# Patient Record
Sex: Female | Born: 1990 | Race: White | Hispanic: Yes | Marital: Single | Smoking: Never smoker
Health system: Southern US, Community
[De-identification: ages and names within clinical notes are randomized; demographics above are authoritative.]

## PROBLEM LIST (undated history)

## (undated) DIAGNOSIS — O24419 Gestational diabetes mellitus in pregnancy, unspecified control: Secondary | ICD-10-CM

---
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
## 2009-01-16 ENCOUNTER — Ambulatory Visit: Payer: Self-pay | Admitting: Physician Assistant

## 2009-01-16 ENCOUNTER — Inpatient Hospital Stay (HOSPITAL_COMMUNITY): Admission: AD | Admit: 2009-01-16 | Discharge: 2009-01-16 | Payer: Self-pay | Admitting: Obstetrics & Gynecology

## 2009-01-18 ENCOUNTER — Ambulatory Visit (HOSPITAL_COMMUNITY): Admission: RE | Admit: 2009-01-18 | Discharge: 2009-01-18 | Payer: Self-pay | Admitting: Obstetrics & Gynecology

## 2009-02-08 ENCOUNTER — Ambulatory Visit (HOSPITAL_COMMUNITY): Admission: RE | Admit: 2009-02-08 | Discharge: 2009-02-08 | Payer: Self-pay | Admitting: Obstetrics & Gynecology

## 2009-03-03 IMAGING — US US OB COMP LESS 14 WK
1 series · 14 of 28 positions shown · non-contrast
Comparison: none

CLINICAL DATA: A bleeding yesterday

[Series 1: us ob comp less 14 wks · 14 of 41 slices shown]
[im 2/41]
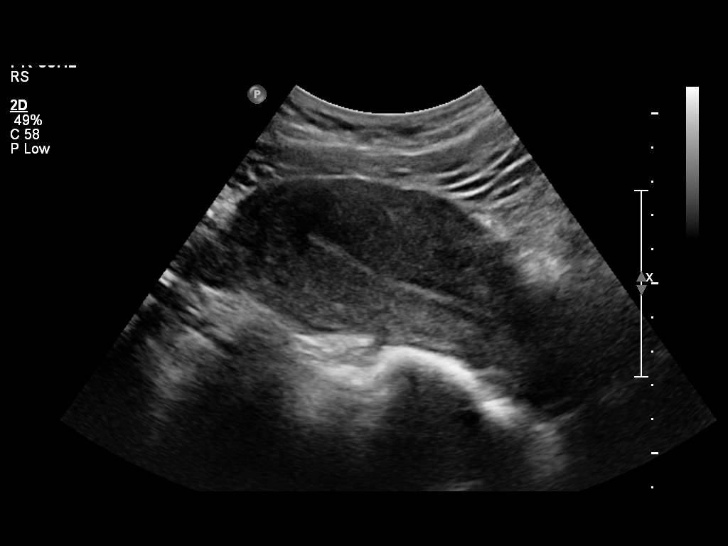
[im 5/41]
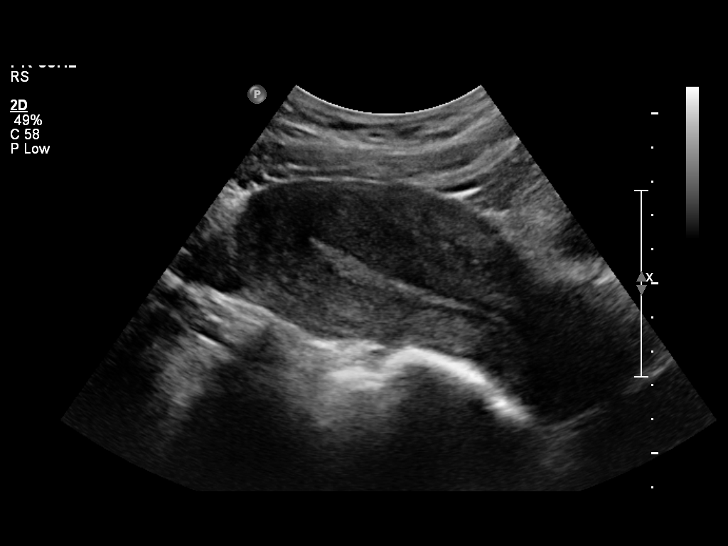
[im 8/41]
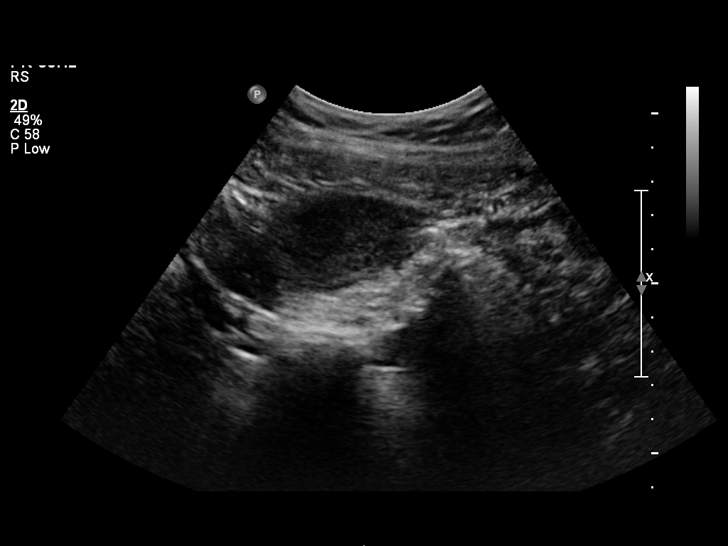
[im 11/41]
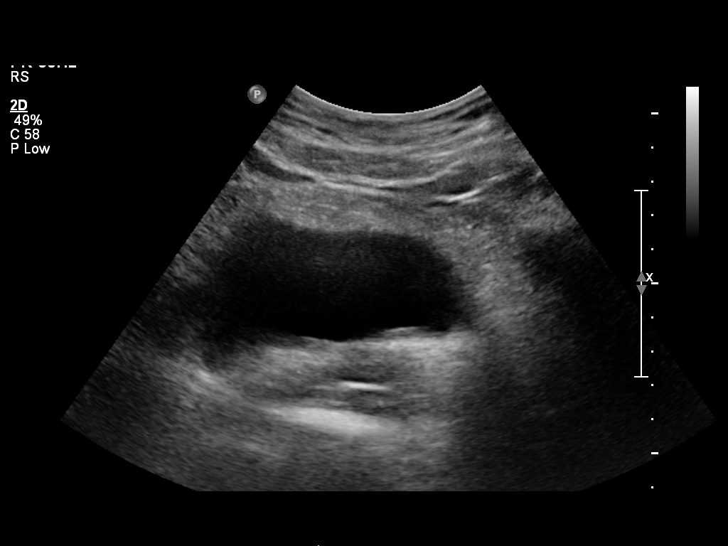
[im 14/41]
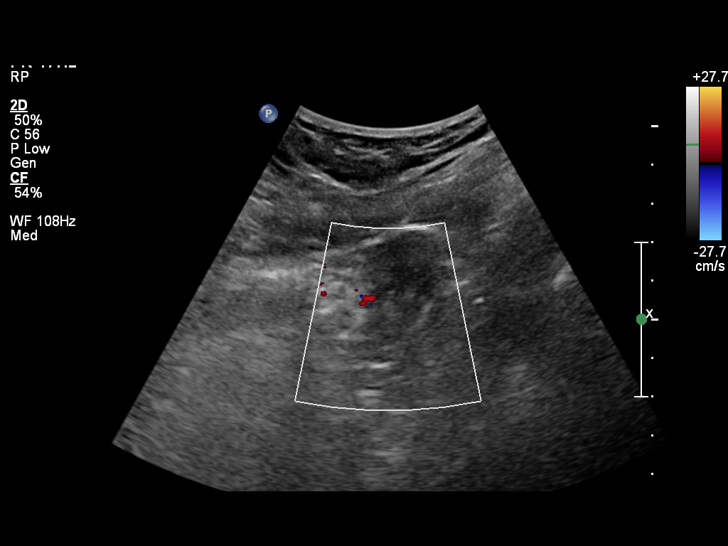
[im 17/41]
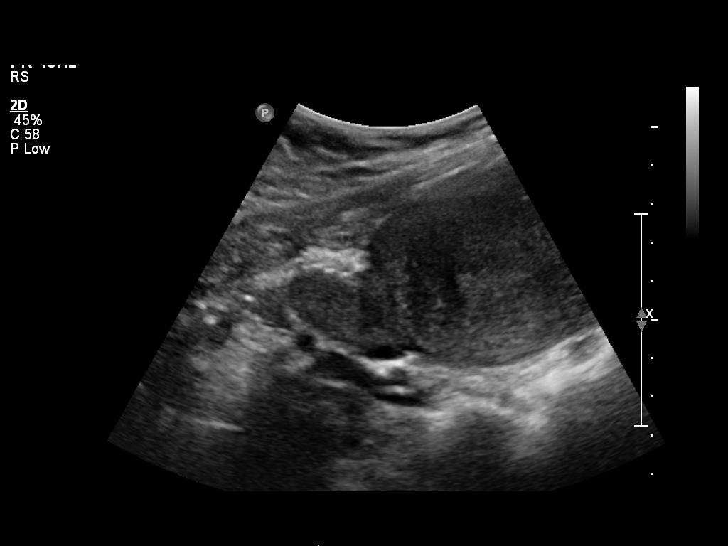
[im 20/41]
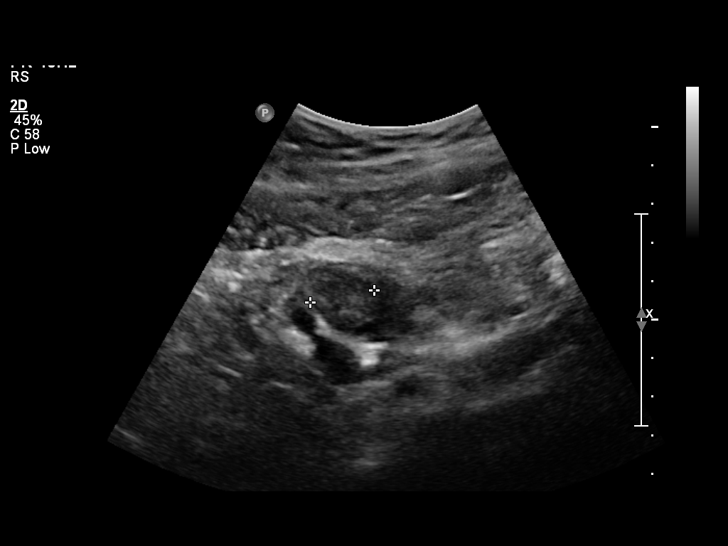
[im 23/41]
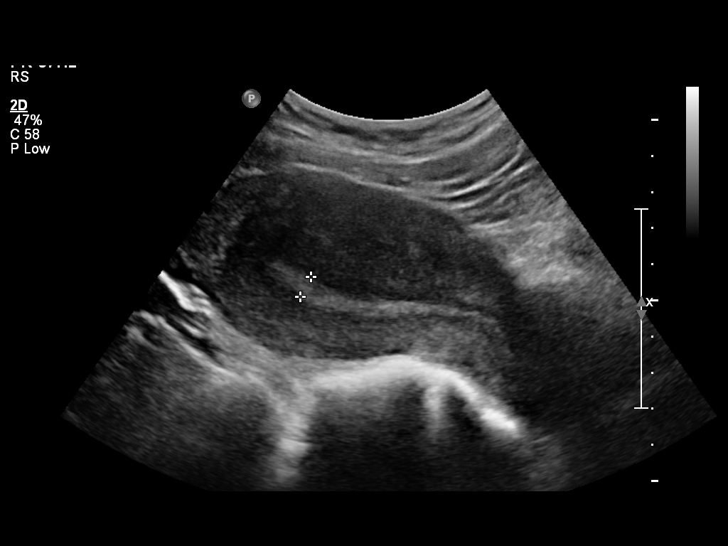
[im 26/41]
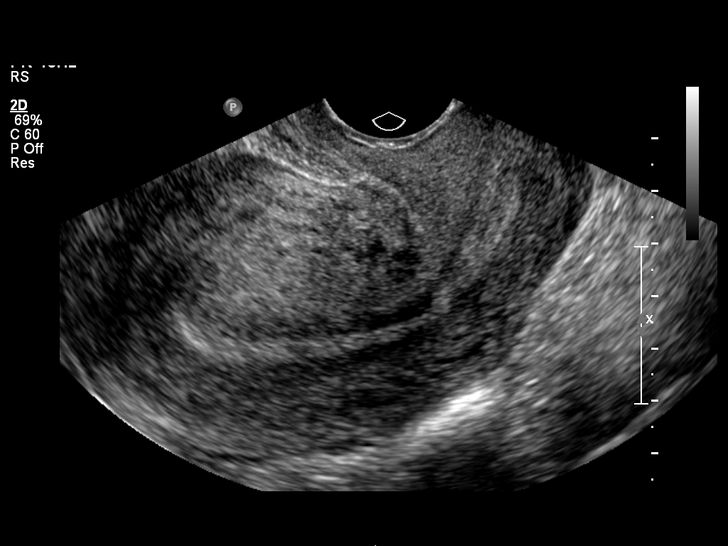
[im 29/41]
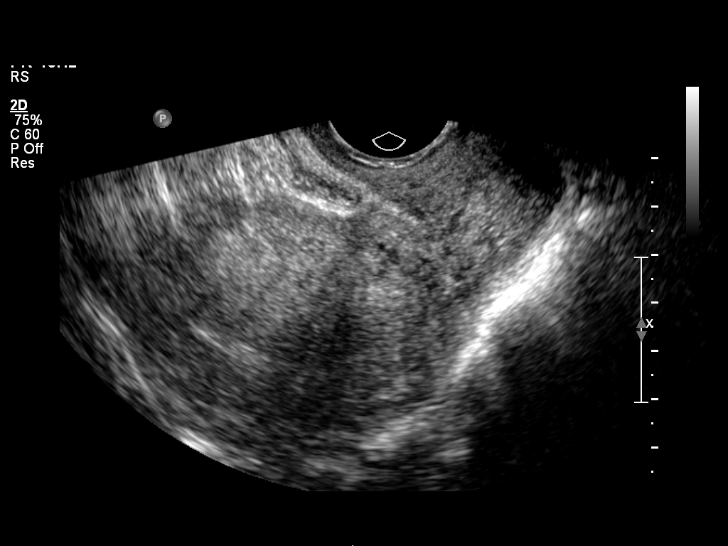
[im 32/41]
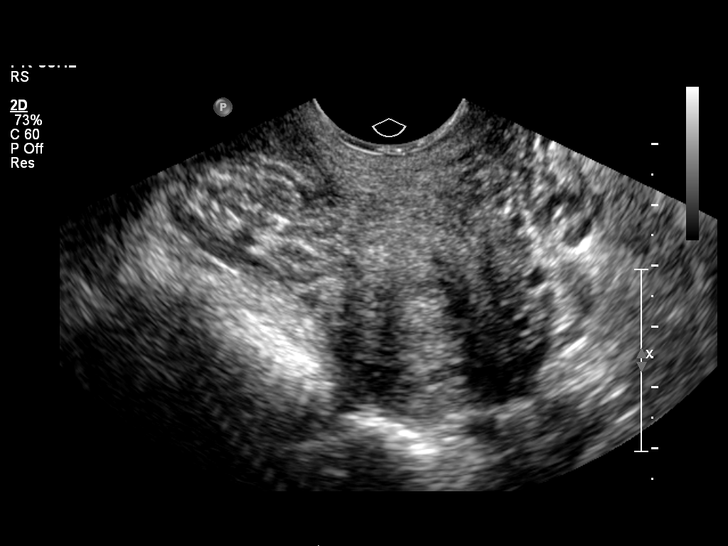
[im 35/41]
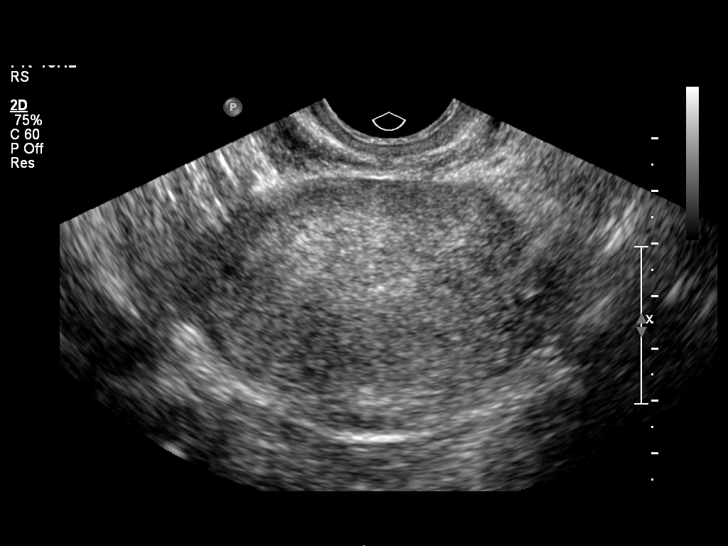
[im 38/41]
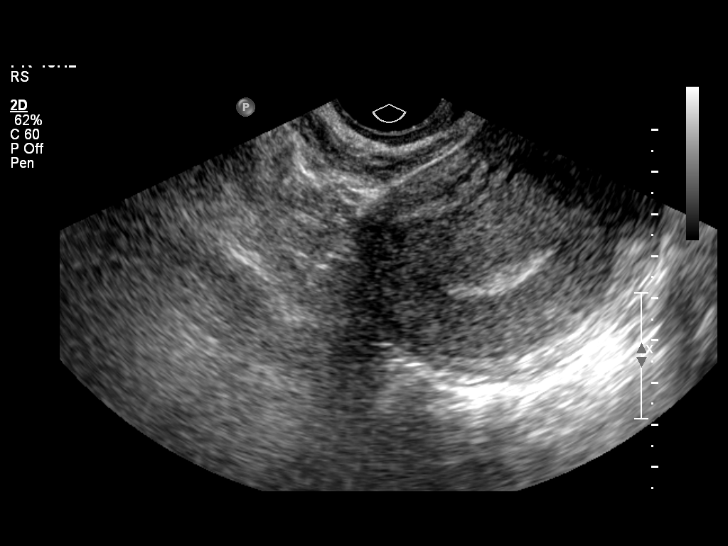
[im 41/41]
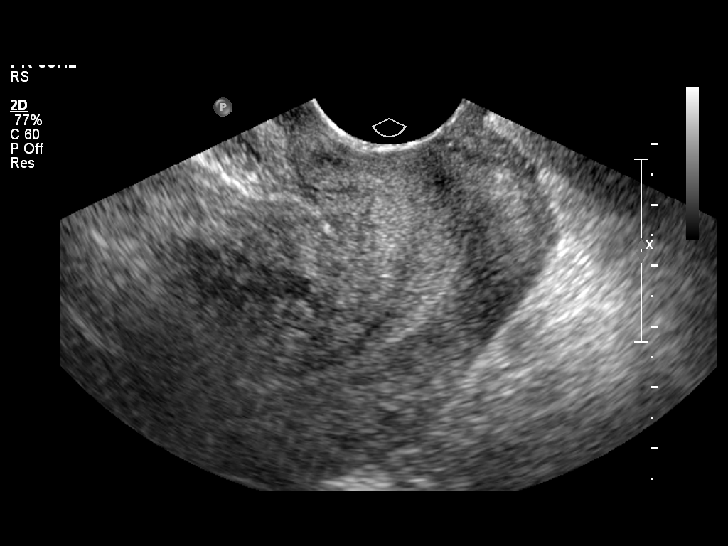

[14 of 28 positions shown; findings below may reference images not displayed]

FINDINGS: There is no gestational sac, yolk sac, embryo or cardiac
activity identified.  The endometrium measures 12.6 mm with a
trilaminar appearance.  Both ovaries are normal appearance with
normal blood flow.  No free fluid identified.
IMPRESSION: There is no evidence for an intrauterine pregnancy.
No abnormal masses or free fluid.

## 2009-05-28 ENCOUNTER — Inpatient Hospital Stay (HOSPITAL_COMMUNITY): Admission: AD | Admit: 2009-05-28 | Discharge: 2009-05-28 | Payer: Self-pay | Admitting: Obstetrics & Gynecology

## 2009-05-30 ENCOUNTER — Inpatient Hospital Stay (HOSPITAL_COMMUNITY): Admission: AD | Admit: 2009-05-30 | Discharge: 2009-05-31 | Payer: Self-pay | Admitting: Obstetrics & Gynecology

## 2009-05-30 ENCOUNTER — Ambulatory Visit: Payer: Self-pay | Admitting: Obstetrics & Gynecology

## 2010-03-27 IMAGING — US US OB DETAIL+14 WK
1 series · 14 of 28 positions shown · non-contrast
Comparison: none

OBSTETRICAL ULTRASOUND:
 This ultrasound exam was performed in the [HOSPITAL] Ultrasound Department.  The OB US report was generated in the AS system, and faxed to the ordering physician.  This report is also available in [REDACTED] PACS.

[Series 1: us ob detail +14 wk · 93 acquisitions, 14 frames shown]
[im 4/93]
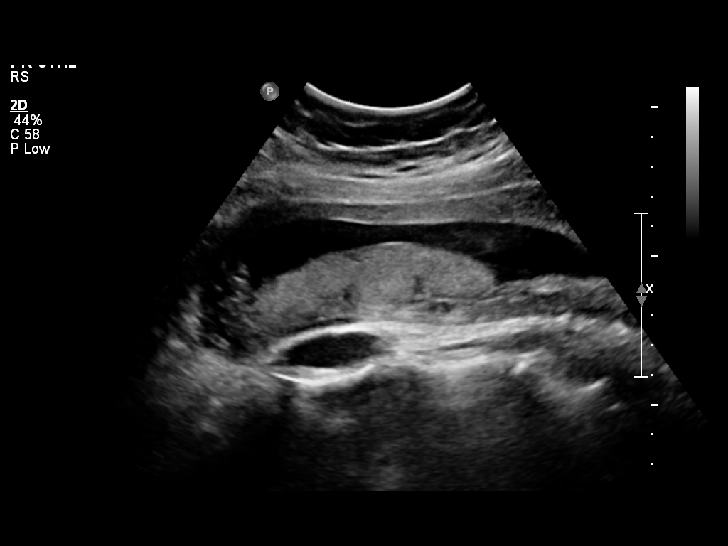
[im 11/93]
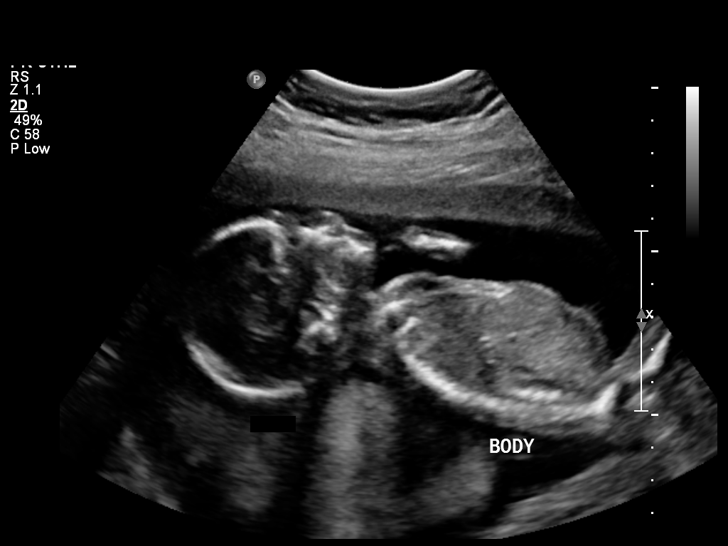
[im 18/93]
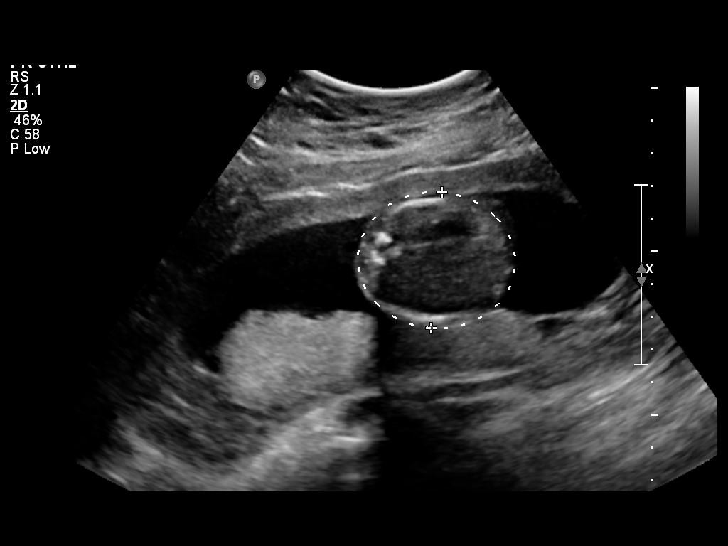
[im 24/93]
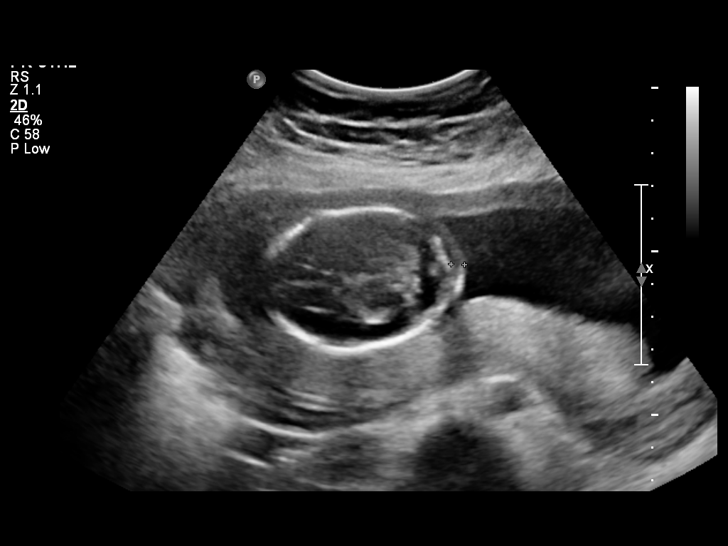
[im 31/93]
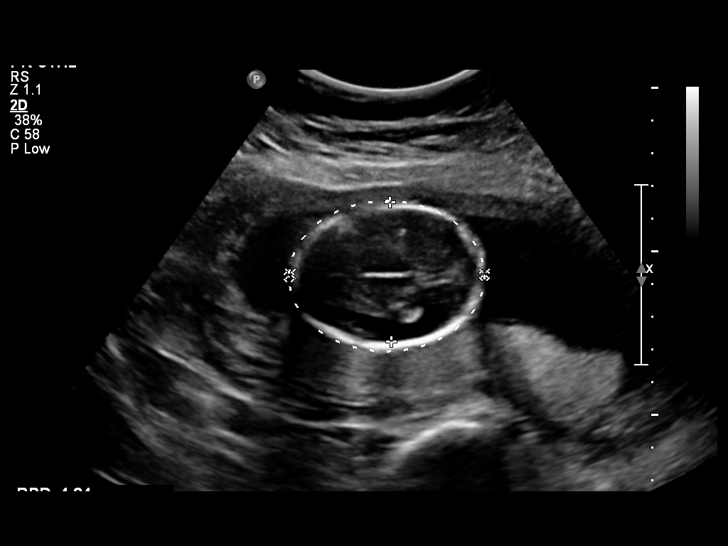
[im 38/93]
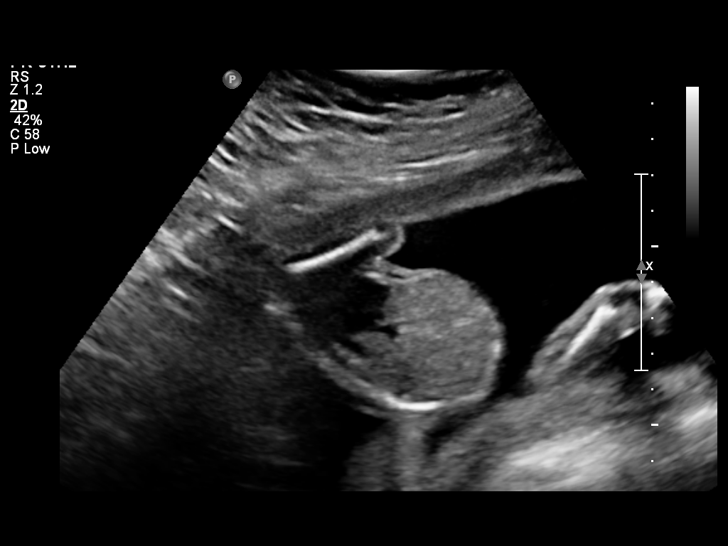
[im 45/93]
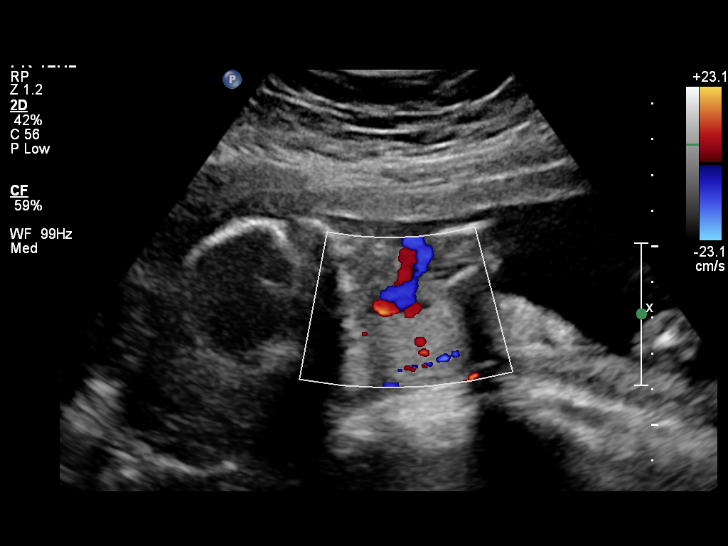
[im 52/93]
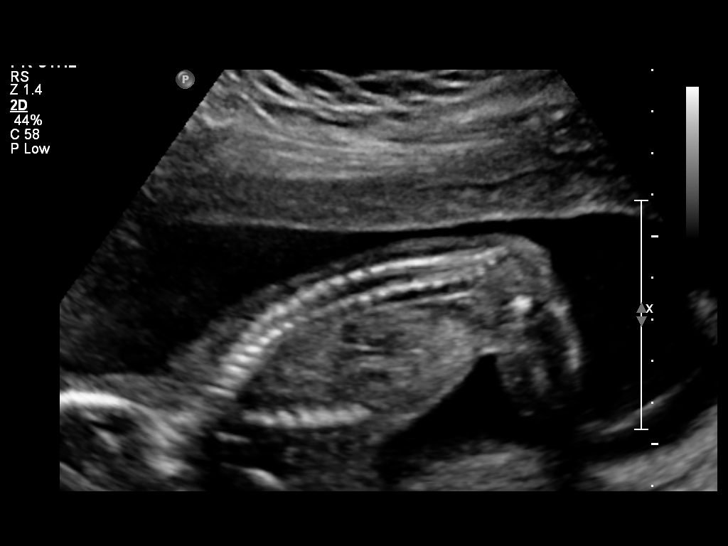
[im 58/93]
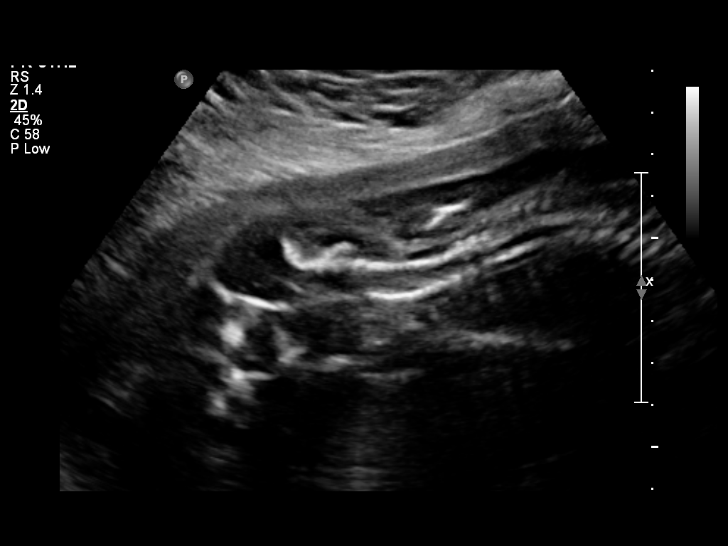
[im 65/93]
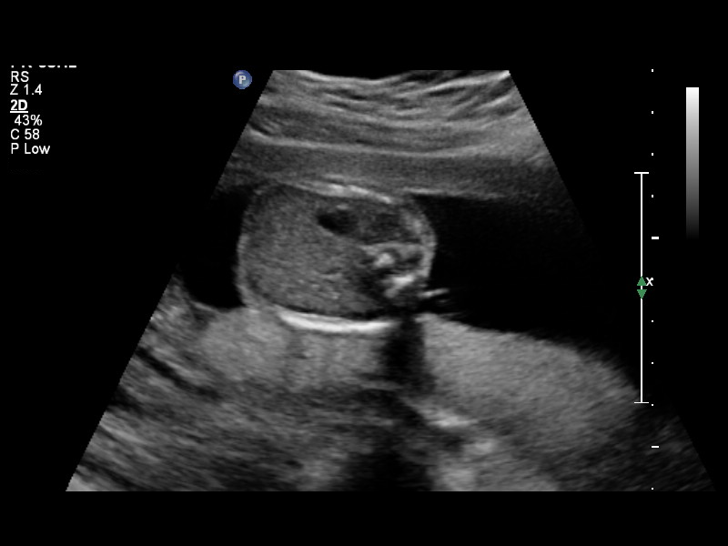
[im 72/93]
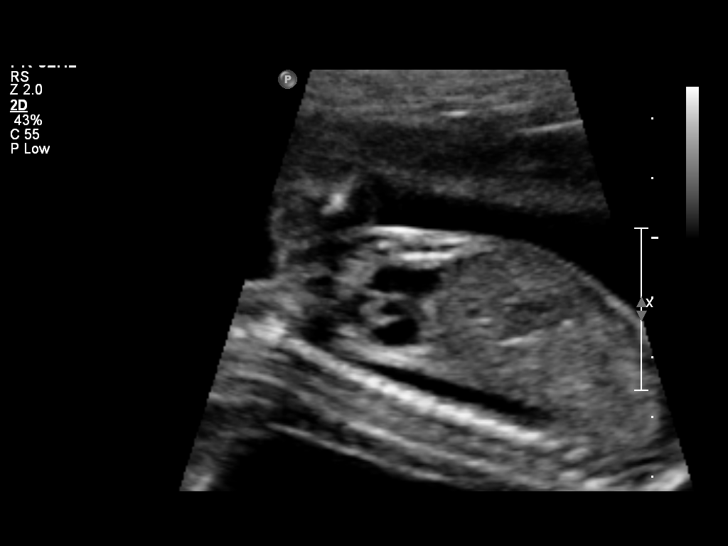
[im 79/93]
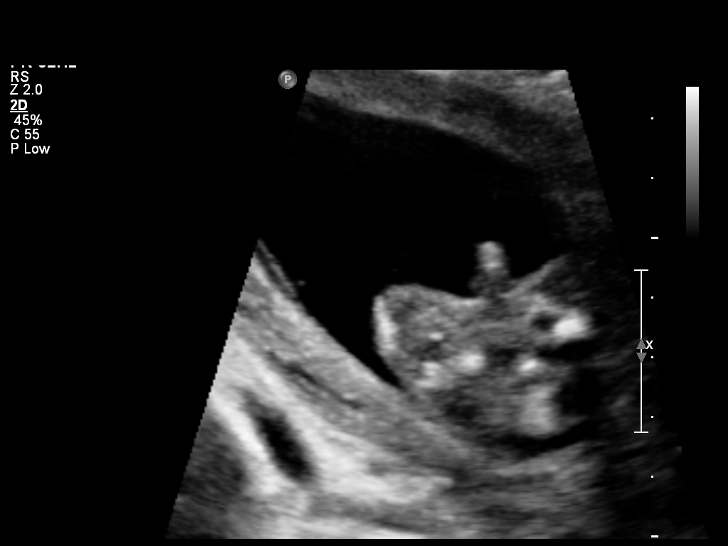
[im 86/93]
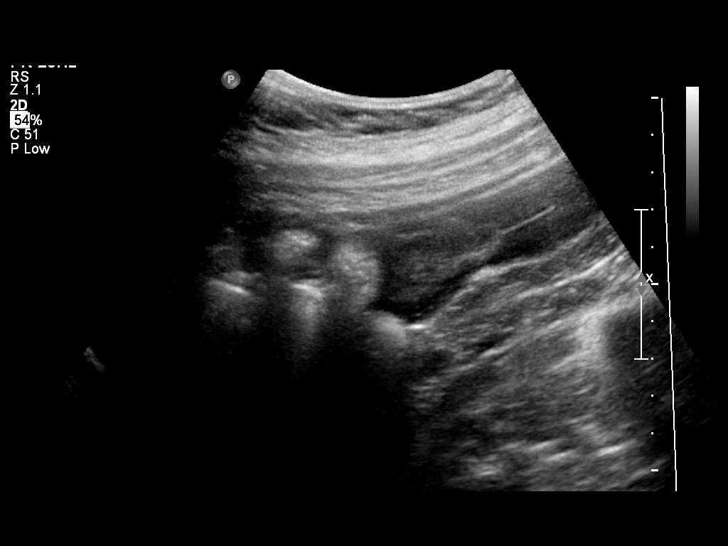
[im 93/93]
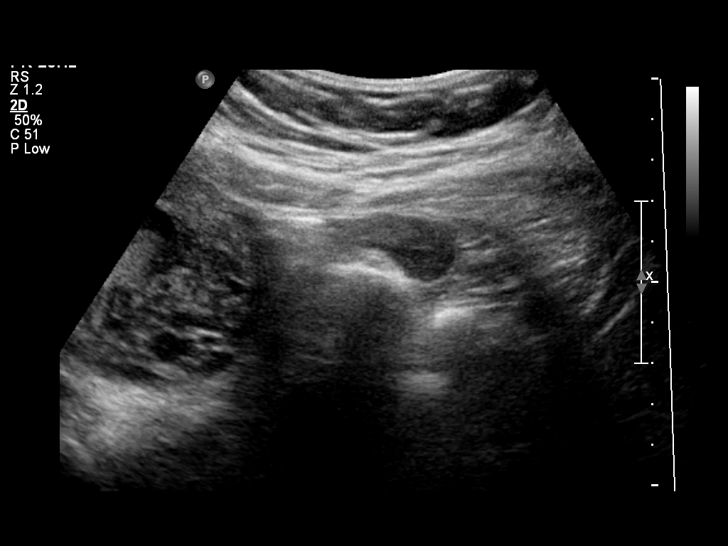

[14 of 28 positions shown; findings below may reference images not displayed]

IMPRESSION: See AS Obstetric US report.

## 2010-09-21 NOTE — L&D Delivery Note (Signed)
Delivery Note At 6:43 PM a viable female was delivered via Vaginal, Spontaneous Delivery (Presentation: Right Occiput Anterior).  APGAR: 7, 10; weight 7 lb 8.3 oz (3410 g).   Placenta status: Intact, Spontaneous by Veatrice Kells  Cord: 3 vessels with the following complications: None. Nuchal foot x 2   Anesthesia: None  Episiotomy: None Lacerations: None Est. Blood Loss (mL): 250cc  Mom to postpartum.  Baby to nursery-stable.  Charisma Charlot E. 04/08/2011, 7:15 PM

## 2010-12-17 ENCOUNTER — Other Ambulatory Visit: Payer: Self-pay | Admitting: Family Medicine

## 2010-12-17 DIAGNOSIS — Z3689 Encounter for other specified antenatal screening: Secondary | ICD-10-CM

## 2010-12-17 LAB — ABO/RH: RH Type: POSITIVE

## 2010-12-22 ENCOUNTER — Ambulatory Visit (HOSPITAL_COMMUNITY)
Admission: RE | Admit: 2010-12-22 | Discharge: 2010-12-22 | Disposition: A | Discharge status: Home / Self Care | Payer: Medicaid Other | Source: Ambulatory Visit | Attending: Family Medicine | Admitting: Family Medicine

## 2010-12-22 DIAGNOSIS — Z363 Encounter for antenatal screening for malformations: Secondary | ICD-10-CM | POA: Insufficient documentation

## 2010-12-22 DIAGNOSIS — Z3689 Encounter for other specified antenatal screening: Secondary | ICD-10-CM

## 2010-12-22 DIAGNOSIS — Z1389 Encounter for screening for other disorder: Secondary | ICD-10-CM | POA: Insufficient documentation

## 2010-12-22 DIAGNOSIS — O358XX0 Maternal care for other (suspected) fetal abnormality and damage, not applicable or unspecified: Secondary | ICD-10-CM | POA: Insufficient documentation

## 2010-12-26 LAB — CBC
HCT: 37.1 % (ref 36.0–46.0)
Hemoglobin: 12.7 g/dL (ref 12.0–15.0)
MCHC: 34.3 g/dL (ref 30.0–36.0)
MCHC: 34.3 g/dL (ref 30.0–36.0)
MCV: 95.8 fL (ref 78.0–100.0)
Platelets: 147 10*3/uL — ABNORMAL LOW (ref 150–400)
Platelets: 152 10*3/uL (ref 150–400)
RBC: 3.64 MIL/uL — ABNORMAL LOW (ref 3.87–5.11)
RBC: 3.88 MIL/uL (ref 3.87–5.11)
WBC: 11.6 10*3/uL — ABNORMAL HIGH (ref 4.0–10.5)

## 2010-12-26 LAB — URINALYSIS, ROUTINE W REFLEX MICROSCOPIC
Hgb urine dipstick: NEGATIVE
Nitrite: NEGATIVE
Protein, ur: NEGATIVE mg/dL
Specific Gravity, Urine: 1.01 (ref 1.005–1.030)
Urobilinogen, UA: 0.2 mg/dL (ref 0.0–1.0)
pH: 6.5 (ref 5.0–8.0)

## 2011-01-01 LAB — URINALYSIS, ROUTINE W REFLEX MICROSCOPIC
Glucose, UA: NEGATIVE mg/dL
Hgb urine dipstick: NEGATIVE
Leukocytes, UA: NEGATIVE
Protein, ur: NEGATIVE mg/dL
Urobilinogen, UA: 1 mg/dL (ref 0.0–1.0)

## 2011-01-01 LAB — POCT I-STAT, CHEM 8
Calcium, Ion: 1.12 mmol/L (ref 1.12–1.32)
Hemoglobin: 11.9 g/dL — ABNORMAL LOW (ref 12.0–15.0)
Potassium: 3.4 mEq/L — ABNORMAL LOW (ref 3.5–5.1)
TCO2: 20 mmol/L (ref 0–100)

## 2011-01-01 LAB — URINE MICROSCOPIC-ADD ON

## 2011-02-02 ENCOUNTER — Inpatient Hospital Stay (HOSPITAL_COMMUNITY)
Admission: AD | Admit: 2011-02-02 | Discharge: 2011-02-02 | Disposition: A | Discharge status: Home / Self Care | Payer: Self-pay | Source: Ambulatory Visit | Attending: Family Medicine | Admitting: Family Medicine

## 2011-02-02 DIAGNOSIS — O4700 False labor before 37 completed weeks of gestation, unspecified trimester: Secondary | ICD-10-CM

## 2011-02-02 LAB — URINALYSIS, ROUTINE W REFLEX MICROSCOPIC
Bilirubin Urine: NEGATIVE
Nitrite: NEGATIVE
Specific Gravity, Urine: 1.025 (ref 1.005–1.030)
Urobilinogen, UA: 1 mg/dL (ref 0.0–1.0)
pH: 6.5 (ref 5.0–8.0)

## 2011-02-06 NOTE — Discharge Summary (Signed)
[name], [name]    ACCOUNT NO.:  [id number]   MEDICAL RECORD NO.:  [id number]          PATIENT TYPE:  INP   LOCATION:  9149                          FACILITY:  [facility]   PHYSICIAN:  Phil D. Okey Hess, M.D.     DATE OF BIRTH:  05-Oct-1990   DATE OF ADMISSION:  05/25/2005  DATE OF DISCHARGE:  05/29/2005                                 DISCHARGE SUMMARY   ADMISSION DIAGNOSES:  1.  A 20 year old G1, P0, at 32-2/7 weeks, with preterm uterine contractions      and gestational diabetes.  2.  Fetal heart tones reassuring.   DISCHARGE DIAGNOSES:  1.  A 20 year old G1, P0, at 32-5/7 weeks, with preterm uterine contractions      and gestational diabetes.  2.  Fetal heart tones reassuring.   DISCHARGE MEDICATIONS:  1.  Procardia 30 mg XL one tablet p.o. b.i.d.  2.  Prenatal vitamins one tablet p.o. daily.   ADMISSION HISTORY:  A 20 year old G1, P0, at 32-2/7 weeks with preterm  uterine contractions and gestational diabetes and cervical changes, was  admitted to prenatal after terbutaline x3 failed to stop uterine  contractions.   HOSPITAL COURSE:  Problem 1.  PRETERM UTERINE CONTRACTIONS:  Per patient and previous  examination on the day before admission, the cervix was closed.  On day of  admission, the cervix was external os 1 cm and internal os fingertip.  The  patient received at MAU terbutaline 0.25 mg subcutaneously every 30 minutes  x3.  The patient continued to have uterine contractions every four minutes,  therefore was admitted to antenatal.  The patient was placed on Procardia 10  mg IV q.6h.  A wet prep showed bacteria and wbc's too numerous to count.  The patient was placed on metronidazole 500 mg one tablet p.o. b.i.d.  GBS  status was unknown; therefore, Unasyn IV was started.  Certainly, an  infection in this patient could be the cause of preterm uterine  contractions.  An ultrasound on admission showed polyhydramnios.  This  certainly can contribute to preterm  uterine contractions.  Throughout the  rest of hospitalization, uterine contractions decreased and the patient  showed only uterine irritability and uterine contractions one to two in one  hour. The patient did not complain of abdominal pain, LOF, vaginal bleeding,  and fetal movements were always positive.  The patient had a fibronectin  test positive.  GBS was negative.  By day of discharge, SVE showed:  External os 1 cm, internal os fingertip.  Therefore, no cervical changes  were noted during the hospitalization.  By day of discharge, the patient was  clinically stable, no uterine contractions were recorded.  FHT was  reassuring throughout the rest of hospitalization.  The patient was  discharged home on Procardia 30 mg XL one tablet p.o. b.i.d.  The patient  has a follow-up appointment on September 13 at 8:45 a.m. at high-risk clinic  at [facility].  The patient did receive betamethasone 12.5 mg IM  q.24h. x2.  The patient received a four-day course of Unasyn IV.   Problem 2.  GESTATIONAL DIABETES:  Initially CBGs during the a.m. before  breakfast showed blood sugars up to 178.  The patient was placed on insulin  NPH 10 units subcutaneously q.a.m. and q.h.s.  NPH was increased up to 18  units b.i.d.  Initially blood sugars were increased secondary to steroids  q.24h. x2.  By day of discharge, CBGs ranged from 80-90s pre- and  postprandial.  Insulin NPH was discontinued secondary to low blood sugars.  The patient was discharged home on diabetic diet.  The patient will check  blood sugar levels during a.m., which in bed is a fasting, and before and  two hours after meals.  This issue to be followed at high-risk clinic at  [facility] on September 13 at 8:45 a.m.   CONDITION ON DISCHARGE:  Stable.   PERTINENT LABS:  Ultrasound done on September 5 showed a single living  intrauterine pregnancy, mild polyhydramnios, maternal cervix showed 3 cm in  length, no previa, cephalic,  mean gestational age of 34-2/7 weeks.  No fetal  abnormalities.  Fetal fibronectin test was positive.  GC and Chlamydia  negative.  GBS negative.   INSTRUCTIONS GIVEN TO THE PATIENT:  Return to MAU or to see your doctor if  menstrual-like cramps, uterine contractions, low back pain, pelvic pressure,  increased vaginal discharge, vaginal bleeding, leaking of fluids, negative  fetal movement.   FOLLOW-UP APPOINTMENT:  The patient has a follow-up appointment on June 03, 2005, at the high-risk clinic at [facility] at 8:45 a.m.   The patient understands follow-up appointment, medical regimen.  The patient  must rest at home, no sexual activity, no thing per vagina.      Cheryl Medal, MD    ______________________________  Cheryl Hess, M.D.    Cheryl Hess  D:  05/29/2005  T:  05/29/2005  Job:  [id number]

## 2011-03-23 ENCOUNTER — Observation Stay (HOSPITAL_COMMUNITY)
Admission: AD | Admit: 2011-03-23 | Discharge: 2011-03-23 | Disposition: A | Discharge status: Home / Self Care | Payer: Self-pay | Source: Ambulatory Visit | Attending: Obstetrics & Gynecology | Admitting: Obstetrics & Gynecology

## 2011-03-23 DIAGNOSIS — O4700 False labor before 37 completed weeks of gestation, unspecified trimester: Secondary | ICD-10-CM

## 2011-03-23 LAB — CBC
MCH: 32.5 pg (ref 26.0–34.0)
MCHC: 34.7 g/dL (ref 30.0–36.0)
Platelets: 161 10*3/uL (ref 150–400)
RDW: 12.9 % (ref 11.5–15.5)

## 2011-03-23 LAB — RPR: RPR Ser Ql: NONREACTIVE

## 2011-03-31 LAB — STREP B DNA PROBE: GBS: NEGATIVE

## 2011-04-08 ENCOUNTER — Inpatient Hospital Stay (HOSPITAL_COMMUNITY)
Admission: AD | Admit: 2011-04-08 | Discharge: 2011-04-10 | DRG: 775 | Disposition: A | Discharge status: Home / Self Care | Payer: Medicaid Other | Source: Ambulatory Visit | Attending: Obstetrics and Gynecology | Admitting: Obstetrics and Gynecology

## 2011-04-08 ENCOUNTER — Encounter (HOSPITAL_COMMUNITY): Payer: Self-pay

## 2011-04-08 DIAGNOSIS — IMO0001 Reserved for inherently not codable concepts without codable children: Secondary | ICD-10-CM

## 2011-04-08 DIAGNOSIS — Z3490 Encounter for supervision of normal pregnancy, unspecified, unspecified trimester: Secondary | ICD-10-CM

## 2011-04-08 HISTORY — DX: Gestational diabetes mellitus in pregnancy, unspecified control: O24.419

## 2011-04-08 LAB — CBC
HCT: 39.4 % (ref 36.0–46.0)
MCHC: 34.8 g/dL (ref 30.0–36.0)
Platelets: 158 10*3/uL (ref 150–400)
RDW: 12.9 % (ref 11.5–15.5)
WBC: 9.9 10*3/uL (ref 4.0–10.5)

## 2011-04-08 MED ORDER — LANOLIN HYDROUS EX OINT: TOPICAL_OINTMENT | CUTANEOUS | Status: DC | PRN

## 2011-04-08 MED ORDER — OXYCODONE-ACETAMINOPHEN 5-325 MG PO TABS
1.0000 | ORAL_TABLET | ORAL | Status: DC | PRN
Start: 2011-04-08 — End: 2011-04-10

## 2011-04-08 MED ORDER — DIPHENHYDRAMINE HCL 25 MG PO CAPS: 25.0000 mg | ORAL_CAPSULE | Freq: Four times a day (QID) | ORAL | Status: DC | PRN

## 2011-04-08 MED ORDER — FENTANYL 2.5 MCG/ML BUPIVACAINE 1/10 % EPIDURAL INFUSION (WH - ANES)
14.0000 mL/h | INTRAMUSCULAR | Status: DC
  Filled 2011-04-08: qty 60

## 2011-04-08 MED ORDER — EPHEDRINE 5 MG/ML INJ
10.0000 mg | INTRAVENOUS | Status: DC | PRN
  Filled 2011-04-08: qty 4

## 2011-04-08 MED ORDER — ONDANSETRON HCL 4 MG/2ML IJ SOLN: 4.0000 mg | INTRAMUSCULAR | Status: DC | PRN

## 2011-04-08 MED ORDER — CITRIC ACID-SODIUM CITRATE 334-500 MG/5ML PO SOLN: 30.0000 mL | ORAL | Status: DC | PRN

## 2011-04-08 MED ORDER — OXYCODONE-ACETAMINOPHEN 5-325 MG PO TABS: 2.0000 | ORAL_TABLET | ORAL | Status: DC | PRN

## 2011-04-08 MED ORDER — FLEET ENEMA 7-19 GM/118ML RE ENEM: 1.0000 | ENEMA | RECTAL | Status: DC | PRN

## 2011-04-08 MED ORDER — PHENYLEPHRINE 40 MCG/ML (10ML) SYRINGE FOR IV PUSH (FOR BLOOD PRESSURE SUPPORT): 80.0000 ug | PREFILLED_SYRINGE | INTRAVENOUS | Status: DC | PRN

## 2011-04-08 MED ORDER — ZOLPIDEM TARTRATE 5 MG PO TABS: 5.0000 mg | ORAL_TABLET | Freq: Every evening | ORAL | Status: DC | PRN

## 2011-04-08 MED ORDER — LACTATED RINGERS IV SOLN: INTRAVENOUS | Status: DC

## 2011-04-08 MED ORDER — BENZOCAINE-MENTHOL 20-0.5 % EX AERO
1.0000 "application " | INHALATION_SPRAY | CUTANEOUS | Status: DC | PRN
  Administered 2011-04-09: 1 via TOPICAL

## 2011-04-08 MED ORDER — LACTATED RINGERS IV SOLN: 500.0000 mL | Freq: Once | INTRAVENOUS | Status: DC

## 2011-04-08 MED ORDER — SIMETHICONE 80 MG PO CHEW: 80.0000 mg | CHEWABLE_TABLET | ORAL | Status: DC | PRN

## 2011-04-08 MED ORDER — PRENATAL PLUS 27-1 MG PO TABS
1.0000 | ORAL_TABLET | Freq: Every day | ORAL | Status: DC
  Administered 2011-04-09 – 2011-04-10 (×2): 1 via ORAL
  Filled 2011-04-08 (×2): qty 1

## 2011-04-08 MED ORDER — IBUPROFEN 600 MG PO TABS
600.0000 mg | ORAL_TABLET | Freq: Four times a day (QID) | ORAL | Status: DC
  Administered 2011-04-09 – 2011-04-10 (×5): 600 mg via ORAL
  Filled 2011-04-08 (×5): qty 1

## 2011-04-08 MED ORDER — ACETAMINOPHEN 325 MG PO TABS: 650.0000 mg | ORAL_TABLET | ORAL | Status: DC | PRN

## 2011-04-08 MED ORDER — PHENYLEPHRINE 40 MCG/ML (10ML) SYRINGE FOR IV PUSH (FOR BLOOD PRESSURE SUPPORT)
80.0000 ug | PREFILLED_SYRINGE | INTRAVENOUS | Status: DC | PRN
  Filled 2011-04-08: qty 5

## 2011-04-08 MED ORDER — LIDOCAINE HCL (PF) 1 % IJ SOLN: 30.0000 mL | INTRAMUSCULAR | Status: DC | PRN

## 2011-04-08 MED ORDER — OXYTOCIN 20 UNITS IN LACTATED RINGERS INFUSION - SIMPLE
125.0000 mL/h | Freq: Once | INTRAVENOUS | Status: AC
  Administered 2011-04-08: 999 mL/h via INTRAVENOUS
  Filled 2011-04-08: qty 1000

## 2011-04-08 MED ORDER — LACTATED RINGERS IV SOLN: 500.0000 mL | INTRAVENOUS | Status: DC | PRN

## 2011-04-08 MED ORDER — DIPHENHYDRAMINE HCL 50 MG/ML IJ SOLN: 12.5000 mg | INTRAMUSCULAR | Status: DC | PRN

## 2011-04-08 MED ORDER — SENNOSIDES-DOCUSATE SODIUM 8.6-50 MG PO TABS
1.0000 | ORAL_TABLET | Freq: Every day | ORAL | Status: DC
  Administered 2011-04-08: 2 via ORAL
  Administered 2011-04-09: 1 via ORAL

## 2011-04-08 MED ORDER — EPHEDRINE 5 MG/ML INJ: 10.0000 mg | INTRAVENOUS | Status: DC | PRN

## 2011-04-08 MED ORDER — ONDANSETRON HCL 4 MG PO TABS: 4.0000 mg | ORAL_TABLET | ORAL | Status: DC | PRN

## 2011-04-08 MED ORDER — IBUPROFEN 600 MG PO TABS
600.0000 mg | ORAL_TABLET | Freq: Four times a day (QID) | ORAL | Status: DC | PRN
  Administered 2011-04-08: 600 mg via ORAL
  Filled 2011-04-08: qty 1

## 2011-04-08 MED ORDER — ONDANSETRON HCL 4 MG/2ML IJ SOLN: 4.0000 mg | Freq: Four times a day (QID) | INTRAMUSCULAR | Status: DC | PRN

## 2011-04-08 MED ORDER — WITCH HAZEL-GLYCERIN EX PADS: MEDICATED_PAD | CUTANEOUS | Status: DC | PRN

## 2011-04-08 MED ORDER — TETANUS-DIPHTH-ACELL PERTUSSIS 5-2.5-18.5 LF-MCG/0.5 IM SUSP
0.5000 mL | Freq: Once | INTRAMUSCULAR | Status: AC
  Administered 2011-04-09: 0.5 mL via INTRAMUSCULAR
  Filled 2011-04-08: qty 0.5

## 2011-04-08 NOTE — Progress Notes (Signed)
G4P3 contractions since the morning small amount of bleeding

## 2011-04-08 NOTE — H&P (Signed)
Cheryl Hess is a 20 y.o. [id number] at [redacted]w[redacted]d presenting for labor. Maternal Medical History:  Reason for admission: Reason for admission: contractions.  Prenatal complications: no prenatal complications   OB History    Grav Para Term Preterm Abortions TAB SAB Ect Mult Living   4 2 1 1 1  1   2      No past medical history on file. No past surgical history on file. Family History: family history is not on file. Social History:  does not have a smoking history on file. She does not have any smokeless tobacco history on file. Her alcohol and drug histories not on file.  Review of Systems  Unable to perform ROS: language    Dilation: 7 Effacement (%): 90 Station: 0 Exam by:: Lucy Chris RNC There were no vitals taken for this visit. Maternal Exam:  Uterine Assessment: Contraction strength is firm.  Contraction frequency is regular.   Abdomen: Fetal presentation: vertex  Introitus: not evaluated.   Pelvis: adequate for delivery.   Cervix: Cervix evaluated by digital exam.     Fetal Exam Fetal Monitor Review: Baseline rate: 130.  Variability: moderate (6-25 bpm).   Pattern: accelerations present.    Fetal State Assessment: Category I - tracings are normal.     Physical Exam  Constitutional: She is oriented to person, place, and time. She appears well-developed and well-nourished. She appears distressed.  Cardiovascular: Normal rate.   Respiratory: Effort normal.  GI: Soft. There is no tenderness.  Musculoskeletal: Normal range of motion.  Neurological: She is alert and oriented to person, place, and time.  Skin: Skin is warm and dry.  Psychiatric: She has a normal mood and affect.    Prenatal labs: ABO, Rh:  A pos  Antibody:  neg Rubella:  imm RPR: NON REACTIVE (07/02 0140)  HBsAg:   neg HIV:   neg GBS: Negative (07/10 0000)   Assessment/Plan: 20 yo [id number] @ 38.[redacted] wks EGA in active labor, stable status Admit to L&D with routine orders, anticipate  SVD   Shaw Dobek 04/08/2011, 6:14 PM

## 2011-04-09 MED ORDER — BENZOCAINE-MENTHOL 20-0.5 % EX AERO
INHALATION_SPRAY | CUTANEOUS | Status: AC
  Administered 2011-04-09: 1 via TOPICAL
  Filled 2011-04-09: qty 56

## 2011-04-09 NOTE — Progress Notes (Signed)
04/09/2011 Cheryl Hess  Interpreter  I assisted the patient with her breakfast order.  

## 2011-04-09 NOTE — Progress Notes (Signed)
Post Partum Day 1  Subjective: No complaints other than mild pain in her lower abdomen. Lochia is becoming lighter over time. Up ad lib with no dizziness or loss of balance, denies HA. + Voiding and + flatus. No BM thus far. She has ordered breakfast. She is breast feeding without problems, but would like to supplement with some formula--Nursing staff aware of this. Ms. Husk would like implanon for [facility].   Objective: Blood pressure 103/64, pulse 73, temperature 98.4 F (36.9 C), temperature source Oral, resp. rate 18, height 5\' 2"  (1.575 m), weight 84.823 kg (187 lb), last menstrual period 07/13/2010, unknown if currently breastfeeding.  Physical Exam:  General: alert, cooperative and no distress, resting comfortably in bed.  Heart: RRR, S1S2 distinct without murmur, clicks, rubs, gallops Lungs: clear to auscultation bilaterally Abdomen: no masses, non-distended, +BS x4, mild tenderness to palpation of lower abdomen with no guarding Uterine Fundus: firm Lochia: appropriate PV: No cords or calf tenderness, No significant calf/ankle edema, DP pulses 2+ bilaterally DVT Evaluation: No evidence of DVT seen on physical exam.    Basename 04/08/11 1820  HGB 13.7  HCT 39.4    Assessment: 19 y/o [id number] s/p NSVD pp day 1  Plan: 1) Continue post partum care 2) Continue breast feeding, supplement with formula as needed 3) Plan for discharge tomorrow    LOS: 1 day   AHLFIELD, Amanuel Sinkfield, PA-S2 04/09/2011, 7:39 AM

## 2011-04-09 NOTE — Discharge Summary (Signed)
Obstetric Discharge Summary Reason for Admission: onset of labor Prenatal Procedures: ultrasound Intrapartum Procedures: spontaneous vaginal delivery Postpartum Procedures: none Complications-Operative and Postpartum: none  Hemoglobin  Date Value Range Status  04/08/2011 13.7  12.0-15.0 (g/dL) Final     HCT  Date Value Range Status  04/08/2011 39.4  36.0-46.0 (%) Final    Discharge Diagnoses: Term Pregnancy-delivered  Discharge Information: Date: 04/09/2011 Activity: pelvic rest Diet: routine Medications: PNV and Ibuprophen Condition: stable Instructions: refer to practice specific booklet and See AVS Discharge to: home   Newborn Data: Live born  Information for the patient's newborn:  Cheryl Hess, Cheryl Hess [[id number]]  female ; APGAR  7/10 ; weight 8#3oz Home with mother.  Cheryl Hess,Cheryl Hess 04/09/2011, 10:05 PM

## 2011-04-09 NOTE — Progress Notes (Signed)
Pt seen and examined with PA-S Karis Juba, agree with above.

## 2011-04-09 NOTE — Progress Notes (Signed)
UR Chart review completed.  

## 2011-04-10 MED ORDER — MEASLES, MUMPS & RUBELLA VAC ~~LOC~~ INJ: 0.5000 mL | INJECTION | Freq: Once | SUBCUTANEOUS | Status: DC

## 2011-04-10 MED ORDER — TETANUS-DIPHTH-ACELL PERTUSSIS 5-2.5-18.5 LF-MCG/0.5 IM SUSP: 0.5000 mL | Freq: Once | INTRAMUSCULAR | Status: DC

## 2011-04-10 MED ORDER — SODIUM CHLORIDE 0.9 % IJ SOLN: 3.0000 mL | INTRAMUSCULAR | Status: DC | PRN

## 2011-04-10 MED ORDER — SODIUM CHLORIDE 0.9 % IJ SOLN: 3.0000 mL | Freq: Two times a day (BID) | INTRAMUSCULAR | Status: DC

## 2011-04-10 MED ORDER — BENZOCAINE-MENTHOL 20-0.5 % EX AERO: 1.0000 "application " | INHALATION_SPRAY | CUTANEOUS | Status: DC | PRN

## 2011-04-10 MED ORDER — IBUPROFEN 600 MG PO TABS: 600.0000 mg | ORAL_TABLET | Freq: Four times a day (QID) | ORAL | Status: AC

## 2011-04-10 MED ORDER — SIMETHICONE 80 MG PO CHEW: 80.0000 mg | CHEWABLE_TABLET | ORAL | Status: DC | PRN

## 2011-04-10 MED ORDER — IBUPROFEN 600 MG PO TABS: 600.0000 mg | ORAL_TABLET | Freq: Four times a day (QID) | ORAL | Status: AC | PRN

## 2011-04-10 MED ORDER — PRENATAL PLUS 27-1 MG PO TABS: 1.0000 | ORAL_TABLET | Freq: Every day | ORAL | Status: DC

## 2011-04-10 MED ORDER — SODIUM CHLORIDE 0.9 % IV SOLN: 250.0000 mL | INTRAVENOUS | Status: DC

## 2011-04-10 MED ORDER — WITCH HAZEL-GLYCERIN EX PADS: MEDICATED_PAD | CUTANEOUS | Status: DC | PRN

## 2011-04-10 MED ORDER — LANOLIN HYDROUS EX OINT: TOPICAL_OINTMENT | CUTANEOUS | Status: DC | PRN

## 2011-04-10 MED ORDER — IBUPROFEN 600 MG PO TABS: 600.0000 mg | ORAL_TABLET | Freq: Four times a day (QID) | ORAL | Status: DC

## 2011-04-10 MED ORDER — OXYCODONE-ACETAMINOPHEN 5-325 MG PO TABS: 1.0000 | ORAL_TABLET | ORAL | Status: DC | PRN

## 2011-04-10 MED ORDER — ONDANSETRON HCL 4 MG/2ML IJ SOLN: 4.0000 mg | INTRAMUSCULAR | Status: DC | PRN

## 2011-04-10 MED ORDER — ZOLPIDEM TARTRATE 5 MG PO TABS: 5.0000 mg | ORAL_TABLET | Freq: Every evening | ORAL | Status: DC | PRN

## 2011-04-10 MED ORDER — ONDANSETRON HCL 4 MG PO TABS: 4.0000 mg | ORAL_TABLET | ORAL | Status: DC | PRN

## 2011-04-10 MED ORDER — SENNOSIDES-DOCUSATE SODIUM 8.6-50 MG PO TABS: 1.0000 | ORAL_TABLET | Freq: Every day | ORAL | Status: DC

## 2011-04-10 MED ORDER — DIPHENHYDRAMINE HCL 25 MG PO CAPS: 25.0000 mg | ORAL_CAPSULE | Freq: Four times a day (QID) | ORAL | Status: DC | PRN

## 2011-04-10 NOTE — Progress Notes (Signed)
PATIENT GIVING A LOT OF BOTTLES.  REVIEWED BREASTFEEDING BASICS.  PT STATES SHE IS READY TO GO HOME AND COMFORTABLE WITH LATCH.  ENCOURAGED PATIENT TO DISCONTINUE FORMULA.

## 2011-04-10 NOTE — Progress Notes (Signed)
Post Partum Day 2  Subjective: no complaints, up ad lib, voiding, tolerating PO and + flatus  Objective: Blood pressure 94/60, pulse 71, temperature 98.4 F (36.9 C), temperature source Oral, resp. rate 18, height 5\' 2"  (1.575 m), weight 84.823 kg (187 lb), last menstrual period 07/13/2010, unknown if currently breastfeeding.  Physical Exam:  General: alert, cooperative and no distress Lochia: appropriate Uterine Fundus: firm Incision:  DVT Evaluation: No evidence of DVT seen on physical exam.   Basename 04/08/11 1820  HGB 13.7  HCT 39.4    Assessment/Plan: Discharge home   LOS: 2 days   CRESENZO-DISHMAN,Shantera Monts 04/10/2011, 7:25 AM

## 2011-04-19 ENCOUNTER — Inpatient Hospital Stay (HOSPITAL_COMMUNITY): Admission: AD | Admit: 2011-04-19 | Payer: Self-pay | Source: Ambulatory Visit | Admitting: Obstetrics & Gynecology

## 2011-06-16 LAB — CBC
HCT: 37.7
Hemoglobin: 13.2
MCHC: 35.1
RBC: 4.15

## 2011-06-16 LAB — HCG, QUANTITATIVE, PREGNANCY: hCG, Beta Chain, Quant, S: 98 — ABNORMAL HIGH

## 2011-06-16 LAB — WET PREP, GENITAL
Clue Cells Wet Prep HPF POC: NONE SEEN
Yeast Wet Prep HPF POC: NONE SEEN

## 2011-06-16 LAB — POCT PREGNANCY, URINE: Preg Test, Ur: POSITIVE

## 2011-06-16 LAB — ABO/RH: ABO/RH(D): A POS

## 2011-07-20 NOTE — Discharge Summary (Signed)
  NAMEJODE, Cheryl Hess NO.:  [id number]  MEDICAL RECORD NO.:  [id number]  LOCATION:  9173                          FACILITY:  [facility]  PHYSICIAN:  Scheryl Darter, MD       DATE OF BIRTH:  [birth year]  DATE OF ADMISSION:  03/23/2011 DATE OF DISCHARGE:  03/23/2011                              DISCHARGE SUMMARY   ADMISSION DIAGNOSES: 1. Intrauterine pregnancy at 36-2/7th weeks. 2. Early labor.  DISCHARGE DIAGNOSES: 1. Intrauterine pregnancy at 36-2/7th weeks. 2. Prodromal contractions without cervical change, not in labor.  HOSPITAL PROCEDURES: 1. Electronic fetal monitoring. 2. IV antibiotics.  HOSPITAL COURSE:  The patient was admitted at 3:00 a.m. with contractions every 3-5 minutes.  Cervix was 3-cm thick and -2.  Fetal heart rate was reactive.  She was admitted for presumed labor but over the course of the next several hours her contractions became less and less painful and less frequent and her cervix remained unchanged by 8:00 a.m. and decision was made at that point to discharge home with labor precautions.  We used an interpreter to discuss this with the patient and the patient is agreeable.  Upon discharge, fetal heart rate is reactive and contractions are every 5-7 minutes and mild.  Cervix was unchanged and she was deemed to have received full benefit of her hospital stay and was discharged home.  DISCHARGE MEDICATIONS:  Prenatal vitamins.  DISCHARGE LABORATORY DATA:  None.  DISCHARGE INSTRUCTIONS:  The patient was given routine labor precautions and states understanding.  DISCHARGE FOLLOWUP:  The patient has an appointment on Friday at [facility] but knows that if her labor picks back up and gets more active or if she ruptures her membranes or has bleeding or decreased fetal movement, she will come back in.  CONDITION AT DISCHARGE:  Good.     Marie L. Mayford Knife, C.N.M.   ______________________________ Scheryl Darter, MD    MLW/MEDQ   D:  03/23/2011  T:  03/23/2011  Job:  [id number]  Electronically Signed by Wynelle Bourgeois C.N.M. on 04/28/2011 04:30:32 PM Electronically Signed by Scheryl Darter MD on 07/20/2011 11:05:39 AM

## 2014-07-23 ENCOUNTER — Encounter (HOSPITAL_COMMUNITY): Payer: Self-pay

## 2016-05-26 ENCOUNTER — Encounter (HOSPITAL_COMMUNITY): Payer: Self-pay | Admitting: *Deleted

## 2016-06-05 ENCOUNTER — Ambulatory Visit (HOSPITAL_COMMUNITY)
Admission: RE | Admit: 2016-06-05 | Discharge: 2016-06-05 | Disposition: A | Discharge status: Home / Self Care | Payer: Medicaid Other | Source: Ambulatory Visit | Attending: Obstetrics and Gynecology | Admitting: Obstetrics and Gynecology

## 2016-06-05 ENCOUNTER — Encounter (HOSPITAL_COMMUNITY): Payer: Self-pay | Admitting: *Deleted

## 2016-06-05 VITALS — BP 102/70 | Temp 98.4°F | Ht 62.0 in | Wt 158.6 lb

## 2016-06-05 DIAGNOSIS — Z1239 Encounter for other screening for malignant neoplasm of breast: Secondary | ICD-10-CM

## 2016-06-05 DIAGNOSIS — R87612 Low grade squamous intraepithelial lesion on cytologic smear of cervix (LGSIL): Secondary | ICD-10-CM

## 2016-06-05 NOTE — Progress Notes (Signed)
[facility] due to having an abnormal Pap smear on 05/06/2016 that a colposcopy is recommended for follow up.   Pap Smear: Pap smear not completed today. [facility]. [facility] for [facility]. Referral has been sent and awaiting appointment. Will call patient when receive follow up appointment. Per patient her most recent Pap smear is the only abnormal Pap smear she has had. Last Pap smear result is in EPIC.   Physical exam: Breasts Breasts symmetrical. No skin abnormalities bilateral breasts. No nipple retraction bilateral breasts. No nipple discharge bilateral breasts. No lymphadenopathy. No lumps palpated bilateral breasts. No complaints of pain or tenderness on exam. Screening mammogram recommended at age 25 unless clinically indicated prior.   Pelvic/Bimanual No Pap smear completed today since last Pap smear was 05/06/2016. Pap smear not indicated per BCCCP guidelines.   Smoking History: Patient has never smoked.  Patient Navigation: Patient education provided. Access to services provided for patient through [facility] program. Spanish interpreter provided.  Used Spanish interpreter [place].

## 2016-06-05 NOTE — Patient Instructions (Signed)
Explained breast self awareness to Cheryl Hess. Patient did not need a Pap smear today due to last Pap smear was 05/06/2016. Explained the colposcopy and the importance of follow up for her abnormal Pap smear. [facility] to the [facility] for [facility]. Referral has been sent and awaiting appointment. Will call patient when receive follow up appointment. Informed patient that she will need a screening mammogram at age 25 unless clinically indicated prior. Cheryl Hess verbalized understanding.  Issaih Kaus, Kathaleen Maserhristine Poll, RN 2:11 PM

## 2016-06-09 ENCOUNTER — Encounter (HOSPITAL_COMMUNITY): Payer: Self-pay | Admitting: *Deleted

## 2016-06-16 ENCOUNTER — Telehealth (HOSPITAL_COMMUNITY): Payer: Self-pay | Admitting: *Deleted

## 2016-06-16 NOTE — Telephone Encounter (Signed)
Telephoned patient at home number and left message to return calling concerning appointment about colposcopy. Used interpreter Delorise RoyalsJulie Sowell

## 2016-07-06 ENCOUNTER — Telehealth (HOSPITAL_COMMUNITY): Payer: Self-pay | Admitting: *Deleted

## 2016-07-06 NOTE — Telephone Encounter (Signed)
Telephoned patient at home number and [facility] 8[facility]40. Patient voiced understanding. Used interpreter Delorise RoyalsJulie Sowell.

## 2016-07-14 ENCOUNTER — Other Ambulatory Visit (HOSPITAL_COMMUNITY)
Admission: RE | Admit: 2016-07-14 | Discharge: 2016-07-14 | Disposition: A | Discharge status: Home / Self Care | Payer: Medicaid Other | Source: Ambulatory Visit | Attending: Obstetrics and Gynecology | Admitting: Obstetrics and Gynecology

## 2016-07-14 ENCOUNTER — Encounter: Payer: Self-pay | Admitting: Obstetrics and Gynecology

## 2016-07-14 ENCOUNTER — Ambulatory Visit (INDEPENDENT_AMBULATORY_CARE_PROVIDER_SITE_OTHER): Payer: Self-pay | Admitting: Obstetrics and Gynecology

## 2016-07-14 DIAGNOSIS — R87612 Low grade squamous intraepithelial lesion on cytologic smear of cervix (LGSIL): Secondary | ICD-10-CM

## 2016-07-14 DIAGNOSIS — N870 Mild cervical dysplasia: Secondary | ICD-10-CM | POA: Insufficient documentation

## 2016-07-14 LAB — POCT PREGNANCY, URINE: Preg Test, Ur: NEGATIVE

## 2016-07-14 NOTE — Patient Instructions (Signed)
Colposcopa - Cuidados posteriores (Colposcopy, Care After) Siga estas instrucciones durante las prximas semanas. Estas indicaciones le proporcionan informacin general acerca de cmo deber cuidarse despus del procedimiento. El mdico tambin podr darle instrucciones ms especficas. El tratamiento se ha planificado de acuerdo a las prcticas mdicas actuales, pero a veces se producen problemas. Comunquese con el mdico si tiene algn problema o tiene dudas despus del procedimiento. QU ESPERAR DESPUS DEL PROCEDIMIENTO  Despus del procedimiento, es tpico tener las siguientes sensaciones:  Clicos. Generalmente se calman en algunos minutos.  Dolor. Puede durar hasta dos das.  Aturdimiento. Si esto le ocurre, recustese durante algunos minutos. Podr tener un sangrado leve o una secrecin oscura que debe detenerse en algunos das. Durante este tiempo deber usar un apsito sanitario. INSTRUCCIONES PARA EL CUIDADO EN EL HOGAR  Evite las relaciones sexuales, las duchas vaginales y el uso de tampones durante 3 das, o segn lo que le indique su mdico.  Tome slo medicamentos de venta libre o recetados, segn las indicaciones del mdico. No tome aspirina, ya que puede causar hemorragias.  Si utiliza pldoras anticonceptivas, contine tomndolas.  No todos los resultados estarn disponibles durante su visita. En este caso, tenga otra entrevista con su mdico para conocerlos. No suponga que es normal si no tiene noticias de su mdico o del establecimiento de salud. Es importante el seguimiento de todos los resultados de los estudios.  Siga los consejos de su mdico con respecto a los medicamentos, actividades, visitas y [place] de control. SOLICITE ATENCIN MDICA SI:  Aparece una erupcin cutnea.  Tiene problemas con los medicamentos. SOLICITE ATENCIN MDICA DE INMEDIATO SI:  Tiene una hemorragia abundante o elimina cogulos.  Tiene fiebre.  Tiene flujo vaginal  anormal.  Tiene clicos que no se alivian luego de tomar analgsicos.  Se siente mareada, tiene vahdos o se desmaya.  Siente dolor en el estmago.   Esta informacin no tiene como fin reemplazar el consejo del mdico. Asegrese de hacerle al mdico cualquier pregunta que tenga.   Document Released: 06/28/2013 Elsevier Interactive Patient Education 2016 Elsevier Inc.  

## 2016-07-14 NOTE — Progress Notes (Signed)
Spanish video interpreter [place] [phone] [id number] used for visit

## 2016-07-14 NOTE — Progress Notes (Signed)
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 The prior pap showed LGSIL with no HPV testing.  Prior cervical/vaginal disease: none. Prior cervical treatment: no treatment.  Procedure Details  The risks and benefits of the procedure and Written informed consent obtained.  Speculum placed in vagina and excellent visualization of cervix achieved, cervix swabbed x 3 with acetic acid solution.  Findings: Cervix: acetowhite lesion(s) noted at 12 and 6 o'clock; endocervical curettage performed and cervical biopsies taken at 12 and 6 o'clock. Monsel's solution applied. Pt tolerated well Vaginal inspection: normal without visible lesions. Vulvar colposcopy: vulvar colposcopy not performed.  Specimens: cervical Bx and ECC  Complications: none.  Plan: Specimens labelled and sent to Pathology. Pt will contacted as to follow up as per Bx results

## 2016-08-06 ENCOUNTER — Telehealth: Payer: Self-pay | Admitting: *Deleted

## 2016-08-06 NOTE — Telephone Encounter (Signed)
Per Dr. Alysia PennaErvin- need to call patient and tell her needs pap in one year.  With [employer]pre[employer] called mobile number and unable to leave a message- no voicemail picked up. Called home number left message we are calling with some information- please call office.

## 2016-08-17 NOTE — Telephone Encounter (Signed)
I called Cheryl Hess with interpreter [employer] and explained results of colposcopy and reccomendation to have pap in one year. She voices understanding. She also c/o frequent infections since she has had IUd in for 4 years and wants to get it taken out to see if she has less infections. Does not want another IUD or nexplanon. Does not have insurance- States she will pick up financial application and may also call the health deparment to make appointment.
